# Patient Record
Sex: Female | Born: 1950 | Race: White | Hispanic: No | Marital: Single | Smoking: Former smoker
Health system: Southern US, Community
[De-identification: ages and names within clinical notes are randomized; demographics above are authoritative.]

## PROBLEM LIST (undated history)

## (undated) DIAGNOSIS — F32A Depression, unspecified: Secondary | ICD-10-CM

## (undated) DIAGNOSIS — K589 Irritable bowel syndrome without diarrhea: Secondary | ICD-10-CM

## (undated) DIAGNOSIS — F329 Major depressive disorder, single episode, unspecified: Secondary | ICD-10-CM

## (undated) DIAGNOSIS — F419 Anxiety disorder, unspecified: Secondary | ICD-10-CM

---

## 2015-05-27 ENCOUNTER — Emergency Department (HOSPITAL_BASED_OUTPATIENT_CLINIC_OR_DEPARTMENT_OTHER): Payer: Commercial Managed Care - PPO

## 2015-05-27 ENCOUNTER — Encounter (HOSPITAL_BASED_OUTPATIENT_CLINIC_OR_DEPARTMENT_OTHER): Payer: Self-pay | Admitting: *Deleted

## 2015-05-27 ENCOUNTER — Emergency Department (HOSPITAL_BASED_OUTPATIENT_CLINIC_OR_DEPARTMENT_OTHER)
Admission: EM | Admit: 2015-05-27 | Discharge: 2015-05-27 | Disposition: A | Discharge status: Home / Self Care | Payer: Commercial Managed Care - PPO | Attending: Emergency Medicine | Admitting: Emergency Medicine

## 2015-05-27 DIAGNOSIS — Y9289 Other specified places as the place of occurrence of the external cause: Secondary | ICD-10-CM | POA: Diagnosis not present

## 2015-05-27 DIAGNOSIS — Y998 Other external cause status: Secondary | ICD-10-CM | POA: Insufficient documentation

## 2015-05-27 DIAGNOSIS — W108XXA Fall (on) (from) other stairs and steps, initial encounter: Secondary | ICD-10-CM | POA: Diagnosis not present

## 2015-05-27 DIAGNOSIS — Z87891 Personal history of nicotine dependence: Secondary | ICD-10-CM | POA: Diagnosis not present

## 2015-05-27 DIAGNOSIS — Y9389 Activity, other specified: Secondary | ICD-10-CM | POA: Insufficient documentation

## 2015-05-27 DIAGNOSIS — Z79899 Other long term (current) drug therapy: Secondary | ICD-10-CM | POA: Diagnosis not present

## 2015-05-27 DIAGNOSIS — F329 Major depressive disorder, single episode, unspecified: Secondary | ICD-10-CM | POA: Diagnosis not present

## 2015-05-27 DIAGNOSIS — F419 Anxiety disorder, unspecified: Secondary | ICD-10-CM | POA: Insufficient documentation

## 2015-05-27 DIAGNOSIS — Z8719 Personal history of other diseases of the digestive system: Secondary | ICD-10-CM | POA: Insufficient documentation

## 2015-05-27 DIAGNOSIS — S0990XA Unspecified injury of head, initial encounter: Secondary | ICD-10-CM | POA: Insufficient documentation

## 2015-05-27 HISTORY — DX: Depression, unspecified: F32.A

## 2015-05-27 HISTORY — DX: Anxiety disorder, unspecified: F41.9

## 2015-05-27 HISTORY — DX: Irritable bowel syndrome, unspecified: K58.9

## 2015-05-27 HISTORY — DX: Major depressive disorder, single episode, unspecified: F32.9

## 2015-05-27 NOTE — ED Provider Notes (Signed)
CSN: [id number]     Arrival date & time 05/27/15  [id number] History    By signing my name below, I, Meghan Oneill, attest that this documentation has been prepared under the direction and in the presence of Meghan Nayobert Chiyo Fay, MD.  Electronically Signed: Arlan OrganAshley Oneill, ED Scribe. 05/27/2015. 10:00 PM.   Chief Complaint  Patient presents with  . Fall   HPI  HPI Comments: Meghan Oneill is a 65 y.o. female without any pertinent past medical history who presents to the Emergency Department here after a fall sustained yesterday. Pt states she lost her balance going down a set of stairs when she fell backwards hitting the back of her head against a brick wall. She is unsure of any LOC at time of incident. She now c/o constant, ongoing pain to the back of her head that radiates down the neck along with mild, intermittent numbness/tingling to the arms bilaterally and mild blurred vision. No aggravating or alleviating factors at this time. Ice application attempted to the head prior to arrival without any long term improvement. No recent fever, chills, nausea, vomiting, or abdominal pain. PSHx includes cataract surgery 4 years ago.  PCP: No primary care provider on file.    Past Medical History  Diagnosis Date  . Depression   . IBS (irritable bowel syndrome)   . Anxiety    History reviewed. No pertinent past surgical history. History reviewed. No pertinent family history. Social History  Substance Use Topics  . Smoking status: Former Games developermoker  . Smokeless tobacco: None  . Alcohol Use: None   OB History    No data available     Review of Systems   A complete 10 system review of systems was obtained and all systems are negative except as noted in the HPI and PMH.    Allergies  Other  Home Medications   Prior to Admission medications   Medication Sig Start Date End Date Taking? Authorizing Provider  clonazePAM (KLONOPIN) 1 MG tablet Take 1 mg by mouth 2 (two) times daily.   Yes Historical  Provider, MD  FLUoxetine (PROZAC) 20 MG capsule Take 20 mg by mouth daily.   Yes Historical Provider, MD   Triage Vitals: BP 132/85 mmHg  Pulse 74  Temp(Src) 98.1 F (36.7 C) (Oral)  Resp 20  Ht 5\' 3"  (1.6 m)  Wt 171 lb (77.565 kg)  BMI 30.30 kg/m2  SpO2 98%   Physical Exam  Constitutional: She is oriented to person, place, and time. She appears well-developed and well-nourished. No distress.  HENT:  Head: Normocephalic.    Eyes: Pupils are equal, round, and reactive to light.  Neck: Normal range of motion.  Cardiovascular: Normal rate and intact distal pulses.   Pulmonary/Chest: No respiratory distress.  Abdominal: Normal appearance. She exhibits no distension.  Musculoskeletal: Normal range of motion.  Neurological: She is alert and oriented to person, place, and time. No cranial nerve deficit.  Skin: Skin is warm and dry. No rash noted.  Psychiatric: She has a normal mood and affect. Her behavior is normal.  Nursing note and vitals reviewed.   ED Course  Procedures (including critical care time)  DIAGNOSTIC STUDIES: Oxygen Saturation is 98% on RA, Normal by my interpretation.    COORDINATION OF CARE: 9:56 PM- Will order CT head without contrast and CT cervical spine without contrast. Discussed treatment plan with pt at bedside and pt agreed to plan.     Labs Review Labs Reviewed - No data to display  Imaging Review Ct Head Wo Contrast  05/27/2015  CLINICAL DATA:  Trip and fall on stairs hitting head on brick wall. Unknown loss of consciousness. Now with headache. EXAM: CT HEAD WITHOUT CONTRAST CT CERVICAL SPINE WITHOUT CONTRAST TECHNIQUE: Multidetector CT imaging of the head and cervical spine was performed following the standard protocol without intravenous contrast. Multiplanar CT image reconstructions of the cervical spine were also generated. COMPARISON:  None. FINDINGS: CT HEAD FINDINGS No intracranial hemorrhage, mass effect, or midline shift. No hydrocephalus.  The basilar cisterns are patent. No evidence of territorial infarct. No intracranial fluid collection. Calvarium is intact. Included paranasal sinuses and mastoid air cells are well aerated. CT CERVICAL SPINE FINDINGS No acute fracture or subluxation. Trace anterolisthesis of C3 on C4 is likely degenerative in secondary to facet arthropathy. The alignment is otherwise maintained. The dens is intact. There are no jumped or perched facets. Diffuse disc space narrowing most significant at C5-C6 with associated endplate spurs. Multilevel facet arthropathy. No prevertebral soft tissue edema. IMPRESSION: 1.  No acute intracranial abnormality. 2. 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 Electronically Signed   By: Rubye Oaks M.D.   On: 05/27/2015 22:43   Ct Cervical Spine Wo Contrast  05/27/2015  CLINICAL DATA:  Trip and fall on stairs hitting head on brick wall. Unknown loss of consciousness. Now with headache. EXAM: CT HEAD WITHOUT CONTRAST CT CERVICAL SPINE WITHOUT CONTRAST TECHNIQUE: Multidetector CT imaging of the head and cervical spine was performed following the standard protocol without intravenous contrast. Multiplanar CT image reconstructions of the cervical spine were also generated. COMPARISON:  None. FINDINGS: CT HEAD FINDINGS No intracranial hemorrhage, mass effect, or midline shift. No hydrocephalus. The basilar cisterns are patent. No evidence of territorial infarct. No intracranial fluid collection. Calvarium is intact. Included paranasal sinuses and mastoid air cells are well aerated. CT CERVICAL SPINE FINDINGS No acute fracture or subluxation. Trace anterolisthesis of C3 on C4 is likely degenerative in secondary to facet arthropathy. The alignment is otherwise maintained. The dens is intact. There are no jumped or perched facets. Diffuse disc space narrowing most significant at C5-C6 with associated endplate spurs. Multilevel facet arthropathy. No  prevertebral soft tissue edema. IMPRESSION: 1.  No acute intracranial abnormality. 2. 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 Electronically Signed   By: Rubye Oaks M.D.   On: 05/27/2015 22:43   I have personally reviewed and evaluated these images and lab results as part of my medical decision-making.    MDM   Final diagnoses:  Head injury, initial encounter    I personally performed the services described in this documentation, which was scribed in my presence. The recorded information has been reviewed and considered.   Meghan Nay, MD 05/27/15 [id number]

## 2015-05-27 NOTE — ED Notes (Signed)
Pt verbalizes understanding of d/c instructions and denies any further needs at this time. 

## 2015-05-27 NOTE — ED Notes (Signed)
Pt amb to triage with quick steady gait in nad. Pt reports tripping on her shoes yesterday on stairs, hitting her head on a brick wall. Pt is unsure of loc.

## 2015-05-27 NOTE — Discharge Instructions (Signed)
Head Injury, Adult °You have a head injury. Headaches and throwing up (vomiting) are common after a head injury. It should be easy to wake up from sleeping. Sometimes you must stay in the hospital. Most problems happen within the first 24 hours. Side effects may occur up to 7-10 days after the injury.  °WHAT ARE THE TYPES OF HEAD INJURIES? °Head injuries can be as minor as a bump. Some head injuries can be more severe. More severe head injuries include: °· A jarring injury to the brain (concussion). °· A bruise of the brain (contusion). This mean there is bleeding in the brain that can cause swelling. °· A cracked skull (skull fracture). °· Bleeding in the brain that collects, clots, and forms a bump (hematoma). °WHEN SHOULD I GET HELP RIGHT AWAY?  °· You are confused or sleepy. °· You cannot be woken up. °· You feel sick to your stomach (nauseous) or keep throwing up (vomiting). °· Your dizziness or unsteadiness is getting worse. °· You have very bad, lasting headaches that are not helped by medicine. Take medicines only as told by your doctor. °· You cannot use your arms or legs like normal. °· You cannot walk. °· You notice changes in the black spots in the center of the colored part of your eye (pupil). °· You have clear or bloody fluid coming from your nose or ears. °· You have trouble seeing. °During the next 24 hours after the injury, you must stay with someone who can watch you. This person should get help right away (call 911 in the [place] if you start to shake and are not able to control it (have seizures), you pass out, or you are unable to wake up. °HOW CAN I PREVENT A HEAD INJURY IN THE FUTURE? °· Wear seat belts. °· Wear a helmet while bike riding and playing sports like football. °· Stay away from dangerous activities around the house. °WHEN CAN I RETURN TO NORMAL ACTIVITIES AND ATHLETICS? °See your doctor before doing these activities. You should not do normal activities or play contact sports until 1  week after the following symptoms have stopped: °· Headache that does not go away. °· Dizziness. °· Poor attention. °· Confusion. °· Memory problems. °· Sickness to your stomach or throwing up. °· Tiredness. °· Fussiness. °· Bothered by bright lights or loud noises. °· Anxiousness or depression. °· Restless sleep. °MAKE SURE YOU:  °· Understand these instructions. °· Will watch your condition. °· Will get help right away if you are not doing well or get worse. °  °This information is not intended to replace advice given to you by your health care provider. Make sure you discuss any questions you have with your health care provider. °  °Document Released: 04/13/2008 Document Revised: 05/22/2014 Document Reviewed: 01/06/2013 °Elsevier Interactive Patient Education ©2016 [employer] ° °

## 2015-05-27 NOTE — ED Notes (Signed)
Pt states she fell yesterday and hit her head at 1800.  C/o bump on back of head, but when nurse went to palpate her head, pt jumped and c/o too much pain.  From what nurse was able to feel, no significant bump or trauma noted.  No n/v, pt alert and oriented and able to ambulate without difficulty.

## 2017-01-17 IMAGING — CT CT HEAD W/O CM
2 of 5 series · 12 of 47 positions shown, 15 images · non-contrast
Comparison: None.

CLINICAL DATA: Trip and fall on stairs hitting head on brick wall.
Unknown loss of consciousness. Now with headache.

EXAM:
CT HEAD WITHOUT CONTRAST
CT CERVICAL SPINE WITHOUT CONTRAST
TECHNIQUE: Multidetector CT imaging of the head and cervical spine was
performed following the standard protocol without intravenous
contrast. Multiplanar CT image reconstructions of the cervical spine
were also generated.

[Series 8: coronal bone · coronal · 0.20mm/px · 3 of 31 slices shown]
[im 11/31  brain]
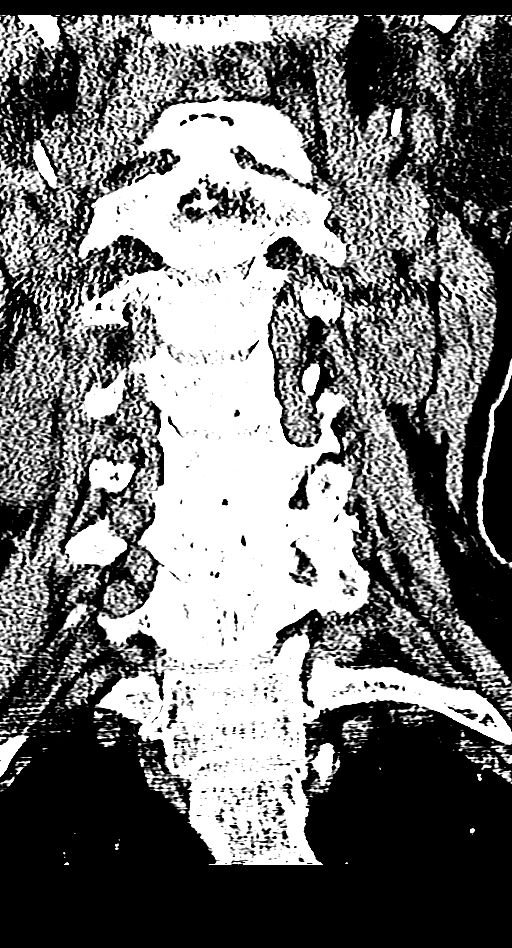
[im 14/31  brain]
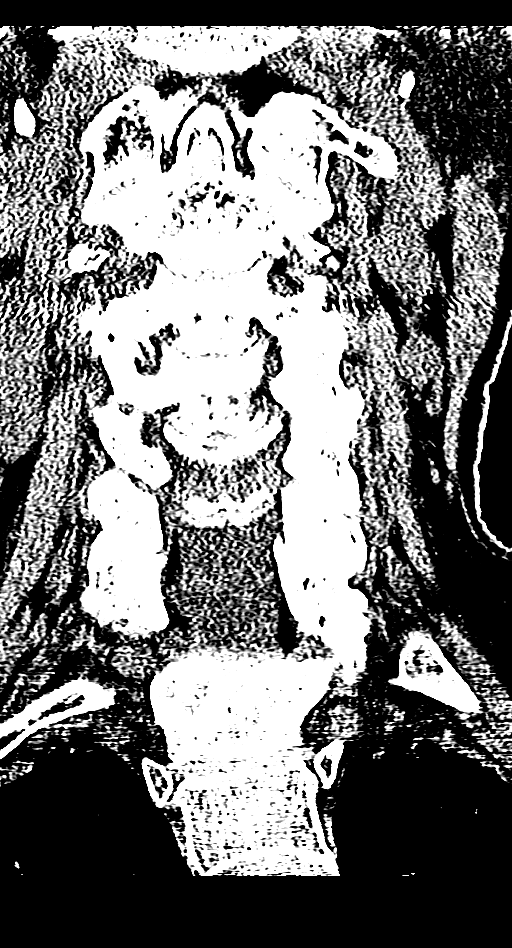
[im 17/31  brain]
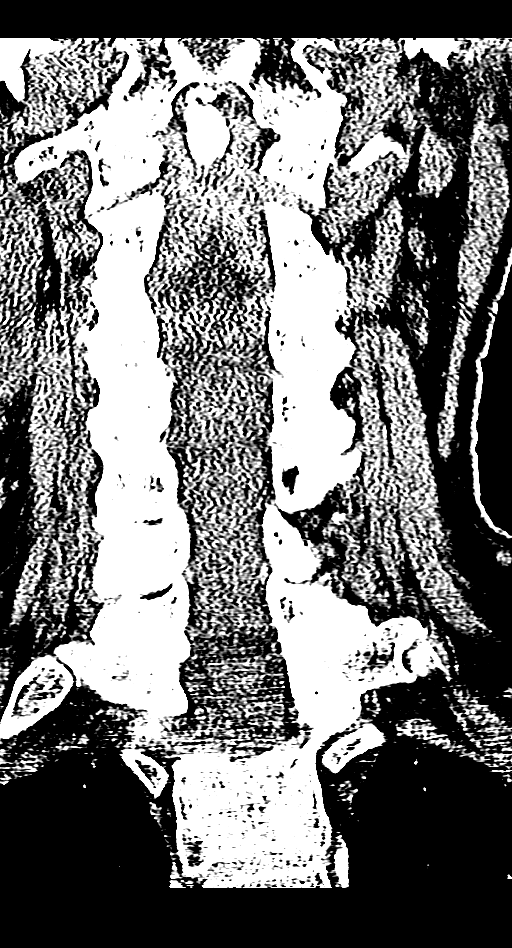

[Series 9: orthogonal axials · axial · 0.18mm/px · z∈[-323,-187]mm · 9 of 90 slices shown, 12 images]
[im 7/90  brain]
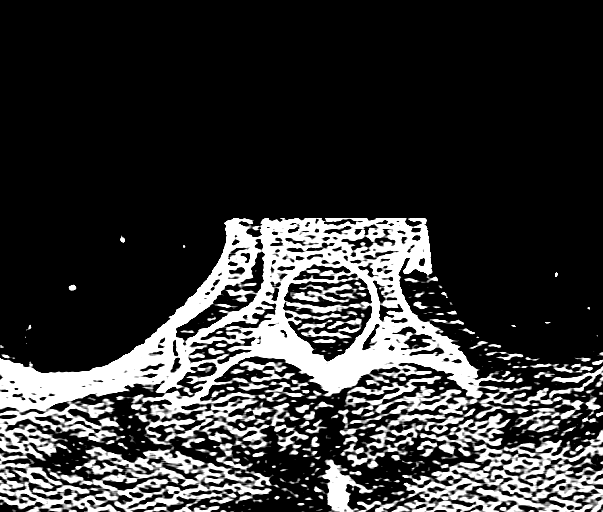
[im 7/90  bone]
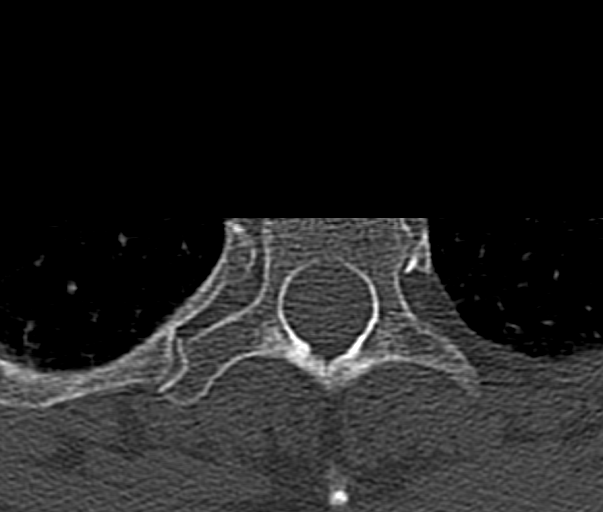
[im 21/90  brain]
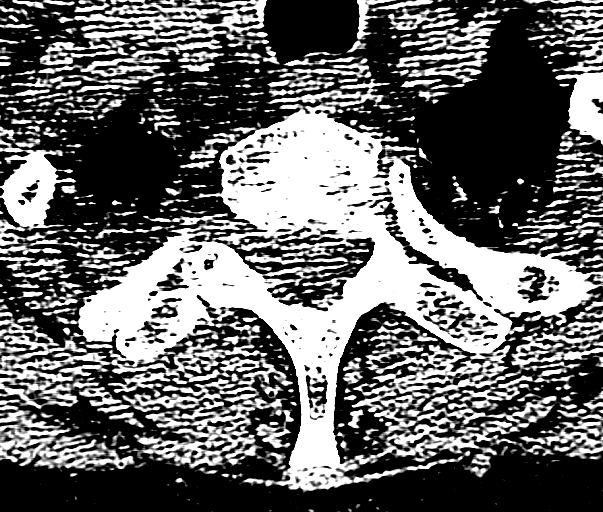
[im 28/90  brain]
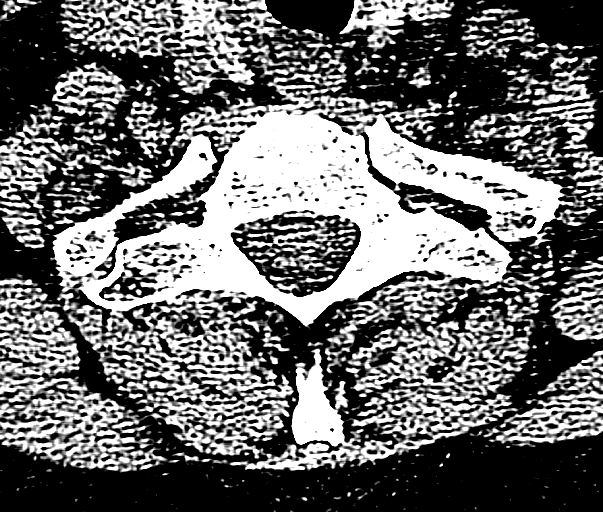
[im 35/90  brain]
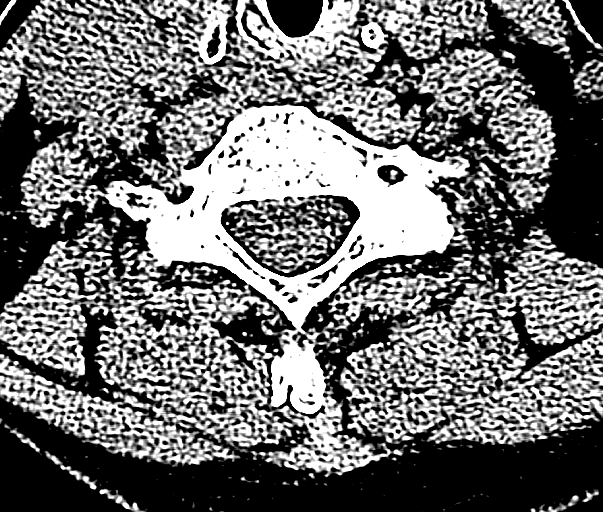
[im 48/90  brain]
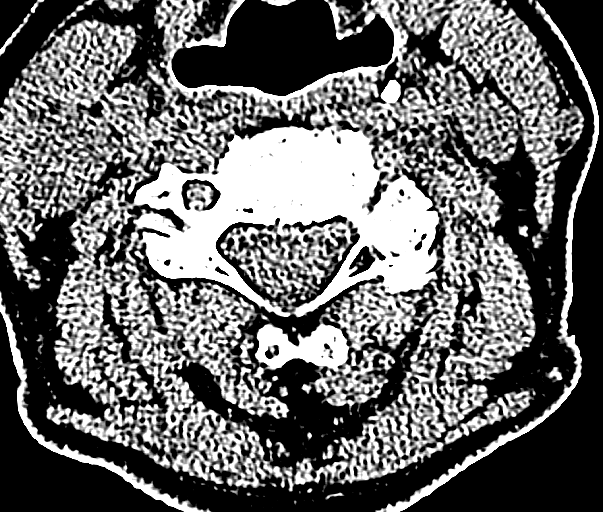
[im 48/90  bone]
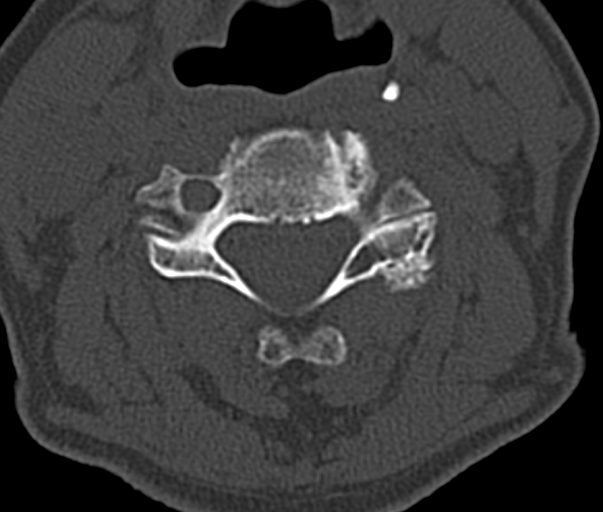
[im 55/90  brain]
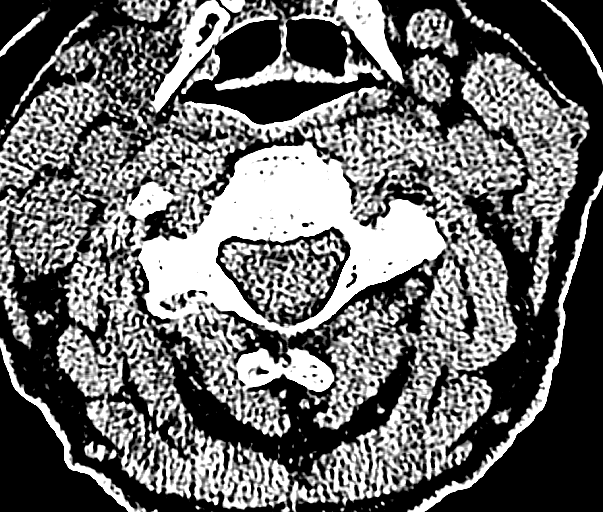
[im 62/90  brain]
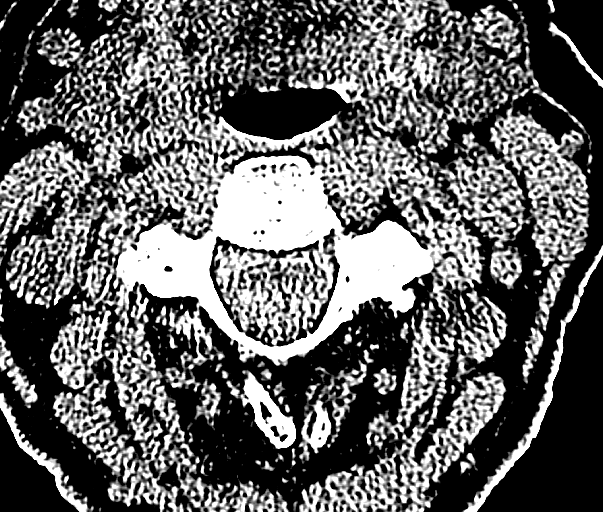
[im 76/90  brain]
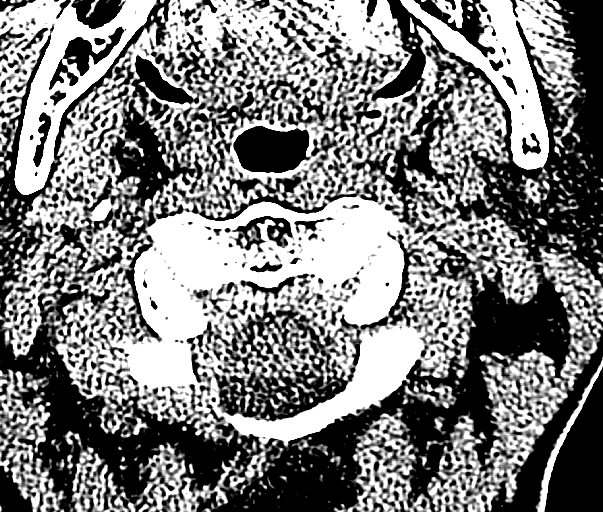
[im 83/90  brain]
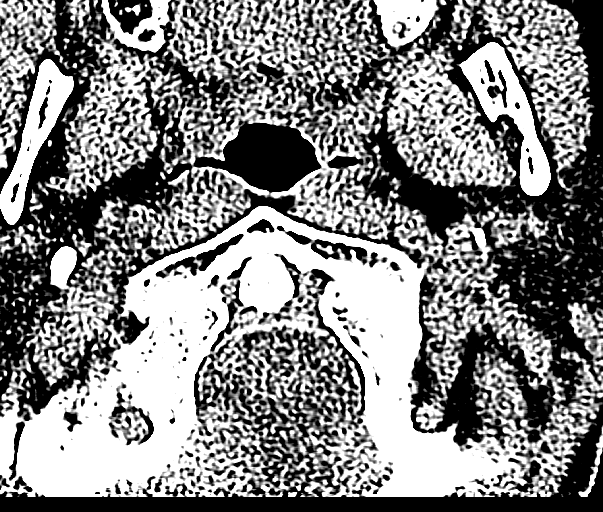
[im 83/90  bone]
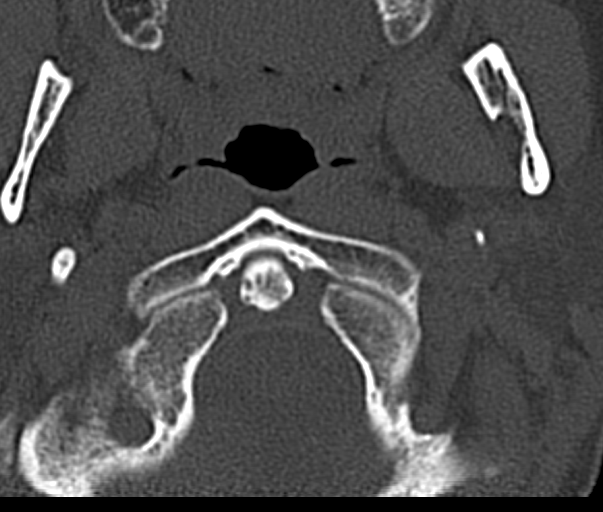

[12 of 47 positions shown; findings below may reference images not displayed]

FINDINGS: CT HEAD FINDINGS

No intracranial hemorrhage, mass effect, or midline shift. No
hydrocephalus. The basilar cisterns are patent. No evidence of
territorial infarct. No intracranial fluid collection. Calvarium is
intact. Included paranasal sinuses and mastoid air cells are well
aerated.

CT CERVICAL SPINE FINDINGS

No acute fracture or subluxation. Trace anterolisthesis of C3 on C4
is likely degenerative in secondary to facet arthropathy. The
alignment is otherwise maintained. The dens is intact. There are no
jumped or perched facets. Diffuse disc space narrowing most
significant at C5-C6 with associated endplate spurs. Multilevel
facet arthropathy. No prevertebral soft tissue edema.
IMPRESSION: 1.  No acute intracranial abnormality.
2. Degenerative change in the cervical spine without acute fracture
or subluxation.
